# Patient Record
Sex: Male | Born: 1995 | Race: Black or African American | Hispanic: No | Marital: Single | Smoking: Never smoker
Health system: Southern US, Community
[De-identification: ages and names within clinical notes are randomized; demographics above are authoritative.]

## PROBLEM LIST (undated history)

## (undated) DIAGNOSIS — M419 Scoliosis, unspecified: Secondary | ICD-10-CM

## (undated) DIAGNOSIS — J3081 Allergic rhinitis due to animal (cat) (dog) hair and dander: Secondary | ICD-10-CM

---
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
## 2014-01-22 ENCOUNTER — Encounter (HOSPITAL_COMMUNITY): Payer: Self-pay | Admitting: Emergency Medicine

## 2014-01-22 ENCOUNTER — Emergency Department (HOSPITAL_COMMUNITY)
Admission: EM | Admit: 2014-01-22 | Discharge: 2014-01-22 | Disposition: A | Discharge status: Home / Self Care | Payer: Managed Care, Other (non HMO) | Attending: Emergency Medicine | Admitting: Emergency Medicine

## 2014-01-22 ENCOUNTER — Emergency Department (HOSPITAL_COMMUNITY): Payer: Managed Care, Other (non HMO)

## 2014-01-22 DIAGNOSIS — M419 Scoliosis, unspecified: Secondary | ICD-10-CM | POA: Insufficient documentation

## 2014-01-22 DIAGNOSIS — R51 Headache: Secondary | ICD-10-CM | POA: Insufficient documentation

## 2014-01-22 DIAGNOSIS — R55 Syncope and collapse: Secondary | ICD-10-CM | POA: Diagnosis present

## 2014-01-22 DIAGNOSIS — M6281 Muscle weakness (generalized): Secondary | ICD-10-CM | POA: Diagnosis not present

## 2014-01-22 DIAGNOSIS — R519 Headache, unspecified: Secondary | ICD-10-CM

## 2014-01-22 LAB — CBG MONITORING, ED: GLUCOSE-CAPILLARY: 103 mg/dL — AB (ref 70–99)

## 2014-01-22 LAB — BASIC METABOLIC PANEL
Anion gap: 6 (ref 5–15)
BUN: 9 mg/dL (ref 6–23)
CALCIUM: 9.3 mg/dL (ref 8.4–10.5)
CHLORIDE: 103 meq/L (ref 96–112)
CO2: 27 mmol/L (ref 19–32)
Creatinine, Ser: 1 mg/dL (ref 0.50–1.35)
GFR calc Af Amer: >90 mL/min (ref >90)
GFR calc non Af Amer: >90 mL/min (ref >90)
Glucose, Bld: 99 mg/dL (ref 70–99)
POTASSIUM: 4.1 mmol/L (ref 3.5–5.1)
SODIUM: 136 mmol/L (ref 135–145)

## 2014-01-22 LAB — CBC
HEMATOCRIT: 42.6 % (ref 39.0–52.0)
Hemoglobin: 14.7 g/dL (ref 13.0–17.0)
MCH: 30 pg (ref 26.0–34.0)
MCHC: 34.5 g/dL (ref 30.0–36.0)
MCV: 86.9 fL (ref 78.0–100.0)
Platelets: 225 10*3/uL (ref 150–400)
RBC: 4.9 MIL/uL (ref 4.22–5.81)
RDW: 12.3 % (ref 11.5–15.5)
WBC: 7.3 10*3/uL (ref 4.0–10.5)

## 2014-01-22 MED ORDER — KETOROLAC TROMETHAMINE 30 MG/ML IJ SOLN
30.0000 mg | Freq: Once | INTRAMUSCULAR | Status: AC
Start: 2014-01-22 — End: 2014-01-22
  Administered 2014-01-22: 30 mg via INTRAVENOUS
  Filled 2014-01-22: qty 1

## 2014-01-22 MED ORDER — SODIUM CHLORIDE 0.9 % IV BOLUS (SEPSIS)
1000.0000 mL | Freq: Once | INTRAVENOUS | Status: AC
  Administered 2014-01-22: 1000 mL via INTRAVENOUS

## 2014-01-22 NOTE — ED Notes (Signed)
cbg [id number] 

## 2014-01-22 NOTE — ED Notes (Signed)
MD at bedside. EDPA HANNAH PRESENT 
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
## 2014-01-22 NOTE — ED Notes (Signed)
Bed: ZO10WA23 Expected date:  Expected time:  Means of arrival:  Comments: HOLD FOR TRIAGE 1

## 2014-01-22 NOTE — Discharge Instructions (Signed)
1. Medications: usual home medications 2. Treatment: rest, drink plenty of fluids,  3. Follow Up: Please followup with your primary doctor in 2 days and cardiology within 1 week for discussion of your diagnoses and further evaluation after today's visit; if you do not have a primary care doctor use the resource guide provided to find one; Please return to the ER for subsequent episodes of syncope, worsening headache, vision changes or other symptoms.    Syncope Syncope is a medical term for fainting or passing out. This means you lose consciousness and drop to the ground. People are generally unconscious for less than 5 minutes. You may have some muscle twitches for up to 15 seconds before waking up and returning to normal. Syncope occurs more often in older adults, but it can happen to anyone. While most causes of syncope are not dangerous, syncope can be a sign of a serious medical problem. It is important to seek medical care.  CAUSES  Syncope is caused by a sudden drop in blood flow to the brain. The specific cause is often not determined. Factors that can bring on syncope include:  Taking medicines that lower blood pressure.  Sudden changes in posture, such as standing up quickly.  Taking more medicine than prescribed.  Standing in one place for too long.  Seizure disorders.  Dehydration and excessive exposure to heat.  Low blood sugar (hypoglycemia).  Straining to have a bowel movement.  Heart disease, irregular heartbeat, or other circulatory problems.  Fear, emotional distress, seeing blood, or severe pain. SYMPTOMS  Right before fainting, you may:  Feel dizzy or light-headed.  Feel nauseous.  See all white or all black in your field of vision.  Have cold, clammy skin. DIAGNOSIS  Your health care provider will ask about your symptoms, perform a physical exam, and perform an electrocardiogram (ECG) to record the electrical activity of your heart. Your health care  provider may also perform other heart or blood tests to determine the cause of your syncope which may include:  Transthoracic echocardiogram (TTE). During echocardiography, sound waves are used to evaluate how blood flows through your heart.  Transesophageal echocardiogram (TEE).  Cardiac monitoring. This allows your health care provider to monitor your heart rate and rhythm in real time.  Holter monitor. This is a portable device that records your heartbeat and can help diagnose heart arrhythmias. It allows your health care provider to track your heart activity for several days, if needed.  Stress tests by exercise or by giving medicine that makes the heart beat faster. TREATMENT  In most cases, no treatment is needed. Depending on the cause of your syncope, your health care provider may recommend changing or stopping some of your medicines. HOME CARE INSTRUCTIONS  Have someone stay with you until you feel stable.  Do not drive, use machinery, or play sports until your health care provider says it is okay.  Keep all follow-up appointments as directed by your health care provider.  Lie down right away if you start feeling like you might faint. Breathe deeply and steadily. Wait until all the symptoms have passed.  Drink enough fluids to keep your urine clear or pale yellow.  If you are taking blood pressure or heart medicine, get up slowly and take several minutes to sit and then stand. This can reduce dizziness. SEEK IMMEDIATE MEDICAL CARE IF:   You have a severe headache.  You have unusual pain in the chest, abdomen, or back.  You are bleeding from  your mouth or rectum, or you have black or tarry stool.  You have an irregular or very fast heartbeat.  You have pain with breathing.  You have repeated fainting or seizure-like jerking during an episode.  You faint when sitting or lying down.  You have confusion.  You have trouble walking.  You have severe weakness.  You  have vision problems. If you fainted, call your local emergency services (911 in [place] Do not drive yourself to the hospital.  MAKE SURE YOU:  Understand these instructions.  Will watch your condition.  Will get help right away if you are not doing well or get worse. Document Released: 12/19/2004 Document Revised: 12/24/2012 Document Reviewed: 02/17/2011 [facility] Patient Information 2015 [place]. This information is not intended to replace advice given to you by your health care provider. Make sure you discuss any questions you have with your health care provider.

## 2014-01-22 NOTE — ED Notes (Signed)
EKG PERFORMED.

## 2014-03-16 ENCOUNTER — Encounter: Payer: Self-pay | Admitting: Physical Therapy

## 2014-03-16 ENCOUNTER — Ambulatory Visit: Payer: Managed Care, Other (non HMO) | Attending: Orthopedic Surgery | Admitting: Physical Therapy

## 2014-03-16 DIAGNOSIS — M419 Scoliosis, unspecified: Secondary | ICD-10-CM

## 2014-03-16 DIAGNOSIS — M546 Pain in thoracic spine: Secondary | ICD-10-CM | POA: Diagnosis not present

## 2014-03-16 DIAGNOSIS — M4124 Other idiopathic scoliosis, thoracic region: Secondary | ICD-10-CM | POA: Insufficient documentation

## 2014-03-16 NOTE — Therapy (Signed)
[facility] [address] [place], [place], [address] Phone: [phone]   Fax:  [phone]  Physical Therapy Evaluation  Patient Details  Name: Michael Grimes MRN: [id number] Date of Birth: 31-May-1995 Referring Provider:  Venita Lick, MD  Encounter Date: 03/16/2014      PT End of Session - 03/16/14 1341    Visit Number 1   Date for PT Re-Evaluation 05/16/14   PT Start Time 1320   PT Stop Time 1405   PT Time Calculation (min) 45 min      Past Medical History  Diagnosis Date  . Scoliosis   . Cat allergies     History reviewed. No pertinent past surgical history.  There were no vitals filed for this visit.  Visit Diagnosis:  Bilateral thoracic back pain - Plan: PT plan of care cert/re-cert  Scoliosis of thoracic spine - Plan: PT plan of care cert/re-cert      Subjective Assessment - 03/16/14 1321    Symptoms Patient c/o pain in the thoracic area for about 4 years.  He reports that he was diagnosed with scoliosis when he was 19 year old.   Pertinent History none   Limitations Sitting;Lifting   How long can you sit comfortably? 30 minutes   Diagnostic tests scoliosis   Patient Stated Goals to have no pain   Currently in Pain? Yes   Pain Score 3    Pain Location Thoracic   Pain Orientation Left   Pain Descriptors / Indicators Aching;Tightness   Pain Type Chronic pain   Pain Frequency Constant   Aggravating Factors  sitting still or trying to go to sllep   Pain Relieving Factors changing positions   Effect of Pain on Daily Activities just uncomfortable            [facility] PT Assessment - 03/16/14 0001    Assessment   Medical Diagnosis thoracic pain   Onset Date 03/16/10   Prior Therapy no   Precautions   Precautions None   Balance Screen   Has the patient fallen in the past 6 months No   Has the patient had a decrease in activity level because of a fear of falling?  No   Is the patient  reluctant to leave their home because of a fear of falling?  No   Prior Function   Vocation --  Drives , some lifting bags   Leisure plays basketball    Posture/Postural Control   Postural Limitations Rounded Shoulders;Forward head;Increased thoracic kyphosis   Posture Comments significant slouched sitting posture, very protracted scapuale   ROM / Strength   AROM / PROM / Strength --  Lumbar ROM decreased 50%, shoulder strength 4-/5   Palpation   Palpation some tenderness in the rhomboids                   [facility] Adult PT Treatment/Exercise - 03/16/14 0001    Posture/Postural Control   Posture/Postural Control --  Has a convex to the right thoracic curve   Modalities   Modalities Administrator, sports Stimulation Location thoracic area   Electrical Stimulation Parameters IFC   Electrical Stimulation Goals Pain                PT Education - 03/16/14 1341    Education provided Yes   Education Details thoracic mobility and scapular stability exercises   Person(s) Educated Patient   Methods Explanation;Demonstration;Handout  Comprehension Verbalized understanding          PT Short Term Goals - 03/16/14 1345    PT SHORT TERM GOAL #1   Title independent with initial HEP   Time 2   Period Weeks   Status New           PT Long Term Goals - 03/16/14 1345    PT LONG TERM GOAL #1   Title report pain decreased 25%   Time 8   Period Weeks   Status New   PT LONG TERM GOAL #2   Title report 25% less difficulty going to sleep   Time 8   Period Weeks   Status New   PT LONG TERM GOAL #3   Title independent with gym activities   Time 8   Period Weeks   Status New   PT LONG TERM GOAL #4   Title understand and demonstrate proper posture and body mechanics   Time 8   Period Weeks   Status New               Plan - 03/16/14 1342    Clinical Impression Statement Pateint with significant scapular winging  and kyphosis.  Has thoracic pain and difficulty with getting comfortable with sitting or sleeping   Pt will benefit from skilled therapeutic intervention in order to improve on the following deficits Decreased range of motion;Decreased strength;Increased muscle spasms;Postural dysfunction;Pain   Rehab Potential Good   PT Frequency 2x / week   PT Duration 8 weeks   PT Treatment/Interventions Moist Heat;Electrical Stimulation;Therapeutic activities;Therapeutic exercise;Patient/family education;Manual techniques;Neuromuscular re-education   PT Next Visit Plan add gym exercises for scapular stabilization   Consulted and Agree with Plan of Care Patient          G-Codes - 03/16/14 1347    Functional Assessment Tool Used FOTO   Functional Limitation Other PT primary   Other PT Primary Current Status (B1478(G8990) At least 20 percent but less than 40 percent impaired, limited or restricted   Other PT Primary Goal Status (G9562(G8991) At least 20 percent but less than 40 percent impaired, limited or restricted       Problem List There are no active problems to display for this patient.   Jearld LeschALBRIGHT,Inmer Nix W, PT 03/16/2014, 1:49 PM  [facility] [address] [place], [place], [address] Phone: [phone]   Fax:  [phone]

## 2014-03-18 ENCOUNTER — Ambulatory Visit: Payer: Managed Care, Other (non HMO) | Admitting: Physical Therapy

## 2014-03-23 ENCOUNTER — Encounter: Payer: Self-pay | Admitting: Physical Therapy

## 2014-03-23 ENCOUNTER — Ambulatory Visit: Payer: Managed Care, Other (non HMO) | Admitting: Physical Therapy

## 2014-03-23 DIAGNOSIS — M546 Pain in thoracic spine: Secondary | ICD-10-CM | POA: Diagnosis not present

## 2014-03-23 DIAGNOSIS — M419 Scoliosis, unspecified: Secondary | ICD-10-CM

## 2014-03-23 NOTE — Therapy (Signed)
[facility] [address] [place], [place], [address] Phone: [phone]   Fax:  [phone]  Physical Therapy Treatment  Patient Details  Name: Michael Grimes MRN: [id number] Date of Birth: [birth year] Referring Provider:  Venita Lick, MD  Encounter Date: 03/23/2014      PT End of Session - 03/23/14 1407    Visit Number 2   Date for PT Re-Evaluation 05/16/14   PT Start Time 1315   PT Stop Time 1417   PT Time Calculation (min) 62 min   Activity Tolerance Patient tolerated treatment well;Patient limited by fatigue   Behavior During Therapy [facility] for tasks assessed/performed      Past Medical History  Diagnosis Date  . Scoliosis   . Cat allergies     History reviewed. No pertinent past surgical history.  There were no vitals filed for this visit.  Visit Diagnosis:  Bilateral thoracic back pain  Scoliosis of thoracic spine      Subjective Assessment - 03/23/14 1315    Symptoms Doing pretty good.   Currently in Pain? Yes   Pain Score 3    Pain Location Thoracic   Pain Orientation Left                       [facility] Adult PT Treatment/Exercise - 03/23/14 0001    Exercises   Exercises Shoulder   Shoulder Exercises: Seated   Row 15 reps  2 sets   Row Weight (lbs) 35   Other Seated Exercises serratus press 35#  2x15   Other Seated Exercises lat pull 35# 2x15   Shoulder Exercises: Prone   Other Prone Exercises modified serratus push up  2x10   Shoulder Exercises: Sidelying   Other Sidelying Exercises plank  3x30 seconds   Other Sidelying Exercises thoracic stretch  3 minutes   Shoulder Exercises: Standing   Extension 15 reps   2 sets   Extension Weight (lbs) 10   Other Standing Exercises right side superman  5# 2x10   Shoulder Exercises: ROM/Strengthening   UBE (Upper Arm Bike) 6 minutes  68fwd/3bk   Shoulder Exercises: Stretch   Other Shoulder Stretches lying on roll right  side  3 minutes   Modalities   Modalities Electrical Stimulation;Moist Heat   Moist Heat Therapy   Number Minutes Moist Heat 15 Minutes   Moist Heat Location Other (comment)  thoracic   Electrical Stimulation   Electrical Stimulation Location thoracic area   Electrical Stimulation Action     Electrical Stimulation Parameters IFC   Electrical Stimulation Goals Pain                  PT Short Term Goals - 03/23/14 1406    PT SHORT TERM GOAL #1   Title independent with initial HEP   Time 2   Period Weeks   Status Achieved           PT Long Term Goals - 03/23/14 1406    PT LONG TERM GOAL #1   Title report pain decreased 25%   Time 8   Period Weeks   Status On-going   PT LONG TERM GOAL #2   Title report 25% less difficulty going to sleep   Time 8   Period Weeks   Status On-going   PT LONG TERM GOAL #3   Title independent with gym activities   Time 8   Period Weeks   Status On-going  PT LONG TERM GOAL #4   Title understand and demonstrate proper posture and body mechanics   Time 8   Period Weeks   Status Achieved               Plan - 03/23/14 1408    Clinical Impression Statement Winging of right scapula.  Significant weakness.   Pt will benefit from skilled therapeutic intervention in order to improve on the following deficits Decreased range of motion;Decreased strength;Increased muscle spasms;Postural dysfunction;Pain   Rehab Potential Good   PT Frequency 2x / week   PT Duration 8 weeks   PT Treatment/Interventions Moist Heat;Electrical Stimulation;Therapeutic activities;Therapeutic exercise;Patient/family education;Manual techniques;Neuromuscular re-education   PT Next Visit Plan Continue to strengthen.   Consulted and Agree with Plan of Care Patient        Problem List There are no active problems to display for this patient.   Haya Hemler PTA 03/23/2014, 2:09 PM  [facility] [address] [place], [place], [address] Phone: [phone]   Fax:  [phone]

## 2014-03-25 ENCOUNTER — Ambulatory Visit: Payer: Managed Care, Other (non HMO) | Admitting: Physical Therapy

## 2014-03-31 ENCOUNTER — Ambulatory Visit: Payer: Managed Care, Other (non HMO) | Admitting: Physical Therapy

## 2014-04-01 ENCOUNTER — Ambulatory Visit: Payer: Managed Care, Other (non HMO) | Admitting: Physical Therapy

## 2014-04-01 ENCOUNTER — Encounter: Payer: Self-pay | Admitting: Physical Therapy

## 2014-04-01 DIAGNOSIS — M546 Pain in thoracic spine: Secondary | ICD-10-CM

## 2014-04-01 DIAGNOSIS — M419 Scoliosis, unspecified: Secondary | ICD-10-CM

## 2014-04-01 NOTE — Therapy (Signed)
[facility] [address] [place], [place], [address] Phone: [phone]   Fax:  [phone]  Physical Therapy Treatment  Patient Details  Name: Michael Grimes MRN: [id number] Date of Birth: 03-13-1995 Referring Provider:  Venita LickBrooks, Dahari, MD  Encounter Date: 04/01/2014      PT End of Session - 04/01/14 1044    Visit Number 3   PT Start Time 1005   PT Stop Time 1101   PT Time Calculation (min) 56 min      Past Medical History  Diagnosis Date  . Scoliosis   . Cat allergies     History reviewed. No pertinent past surgical history.  There were no vitals filed for this visit.  Visit Diagnosis:  Bilateral thoracic back pain  Scoliosis of thoracic spine      Subjective Assessment - 04/01/14 1014    Symptoms (p) No problems, tired   Patient Stated Goals (p) to have no pain   Currently in Pain? (p) Yes   Pain Score (p) 7    Pain Location (p) Thoracic   Pain Orientation (p) Left   Pain Descriptors / Indicators (p) Aching;Tightness   Pain Type (p) Chronic pain   Pain Onset (p) More than a month ago   Pain Frequency (p) Constant                       [facility] Adult PT Treatment/Exercise - 04/01/14 0001    Shoulder Exercises: Seated   Other Seated Exercises serratus press 35#   Other Seated Exercises lat pull 35# 2x15   Shoulder Exercises: Sidelying   Other Sidelying Exercises plank   Other Sidelying Exercises thoracic stretch   Shoulder Exercises: ROM/Strengthening   UBE (Upper Arm Bike) Constant work 40 watts 4 minutes   Shoulder Exercises: Stretch   Other Shoulder Stretches prone swimmers and supermans   Shoulder Exercises: Power Pensions consultantTower   Other Power Tower Exercises 5# scapular stabilization 4 ways 2x 10 each   Other Power Therapist, sportsTower Exercises overhead rythmic stabilization   Modalities   Modalities Electrical Stimulation   Moist Heat Therapy   Number Minutes Moist Heat 15 Minutes   Moist  Heat Location Other (comment)   Emergency planning/management officerlectrical Stimulation   Electrical Stimulation Location thoracic area   Electrical Stimulation Parameters IFC   Electrical Stimulation Goals Pain                  PT Short Term Goals - 03/23/14 1406    PT SHORT TERM GOAL #1   Title independent with initial HEP   Time 2   Period Weeks   Status Achieved           PT Long Term Goals - 03/23/14 1406    PT LONG TERM GOAL #1   Title report pain decreased 25%   Time 8   Period Weeks   Status On-going   PT LONG TERM GOAL #2   Title report 25% less difficulty going to sleep   Time 8   Period Weeks   Status On-going   PT LONG TERM GOAL #3   Title independent with gym activities   Time 8   Period Weeks   Status On-going   PT LONG TERM GOAL #4   Title understand and demonstrate proper posture and body mechanics   Time 8   Period Weeks   Status Achieved  Plan - 04/01/14 1044    Clinical Impression Statement continues to be weak and have difficulty correcting posture   Pt will benefit from skilled therapeutic intervention in order to improve on the following deficits Decreased range of motion;Decreased strength;Increased muscle spasms;Postural dysfunction;Pain   PT Frequency 2x / week   PT Duration 6 weeks   PT Treatment/Interventions Moist Heat;Electrical Stimulation;Therapeutic activities;Therapeutic exercise;Patient/family education;Manual techniques;Neuromuscular re-education        Problem List There are no active problems to display for this patient.   Jearld Lesch, PT 04/01/2014, 10:45 AM  [facility] [address] [place] [address] [place], [place], [address] Phone: [phone]   Fax:  [phone]

## 2014-04-02 ENCOUNTER — Ambulatory Visit: Payer: Managed Care, Other (non HMO) | Admitting: Physical Therapy

## 2014-04-08 ENCOUNTER — Ambulatory Visit: Payer: Managed Care, Other (non HMO) | Attending: Orthopedic Surgery | Admitting: Physical Therapy

## 2014-04-08 DIAGNOSIS — M546 Pain in thoracic spine: Secondary | ICD-10-CM | POA: Insufficient documentation

## 2014-04-08 DIAGNOSIS — M4124 Other idiopathic scoliosis, thoracic region: Secondary | ICD-10-CM | POA: Insufficient documentation

## 2014-09-23 ENCOUNTER — Emergency Department (HOSPITAL_COMMUNITY): Payer: Managed Care, Other (non HMO)

## 2014-09-23 ENCOUNTER — Emergency Department (HOSPITAL_COMMUNITY)
Admission: EM | Admit: 2014-09-23 | Discharge: 2014-09-23 | Disposition: A | Discharge status: Home / Self Care | Payer: Managed Care, Other (non HMO) | Attending: Emergency Medicine | Admitting: Emergency Medicine

## 2014-09-23 ENCOUNTER — Encounter (HOSPITAL_COMMUNITY): Payer: Self-pay | Admitting: Emergency Medicine

## 2014-09-23 DIAGNOSIS — J069 Acute upper respiratory infection, unspecified: Secondary | ICD-10-CM | POA: Diagnosis not present

## 2014-09-23 DIAGNOSIS — R0789 Other chest pain: Secondary | ICD-10-CM

## 2014-09-23 DIAGNOSIS — M419 Scoliosis, unspecified: Secondary | ICD-10-CM | POA: Insufficient documentation

## 2014-09-23 DIAGNOSIS — R059 Cough, unspecified: Secondary | ICD-10-CM

## 2014-09-23 DIAGNOSIS — R05 Cough: Secondary | ICD-10-CM

## 2014-09-23 DIAGNOSIS — R062 Wheezing: Secondary | ICD-10-CM

## 2014-09-23 DIAGNOSIS — R0602 Shortness of breath: Secondary | ICD-10-CM

## 2014-09-23 DIAGNOSIS — R071 Chest pain on breathing: Secondary | ICD-10-CM | POA: Diagnosis not present

## 2014-09-23 DIAGNOSIS — J45901 Unspecified asthma with (acute) exacerbation: Secondary | ICD-10-CM

## 2014-09-23 DIAGNOSIS — J302 Other seasonal allergic rhinitis: Secondary | ICD-10-CM | POA: Insufficient documentation

## 2014-09-23 DIAGNOSIS — R0981 Nasal congestion: Secondary | ICD-10-CM | POA: Insufficient documentation

## 2014-09-23 MED ORDER — LEVOFLOXACIN 750 MG PO TABS: 750.0000 mg | ORAL_TABLET | Freq: Every day | ORAL | Status: AC

## 2014-09-23 MED ORDER — GUAIFENESIN ER 600 MG PO TB12: 600.0000 mg | ORAL_TABLET | Freq: Two times a day (BID) | ORAL | Status: AC | PRN

## 2014-09-23 MED ORDER — ALBUTEROL SULFATE (2.5 MG/3ML) 0.083% IN NEBU
5.0000 mg | INHALATION_SOLUTION | Freq: Once | RESPIRATORY_TRACT | Status: AC
  Administered 2014-09-23: 5 mg via RESPIRATORY_TRACT
  Filled 2014-09-23: qty 6

## 2014-09-23 MED ORDER — IPRATROPIUM BROMIDE 0.02 % IN SOLN
0.5000 mg | Freq: Once | RESPIRATORY_TRACT | Status: AC
  Administered 2014-09-23: 0.5 mg via RESPIRATORY_TRACT
  Filled 2014-09-23: qty 2.5

## 2014-09-23 MED ORDER — GUAIFENESIN ER 600 MG PO TB12
600.0000 mg | ORAL_TABLET | Freq: Once | ORAL | Status: AC
  Administered 2014-09-23: 600 mg via ORAL
  Filled 2014-09-23: qty 1

## 2014-09-23 NOTE — ED Provider Notes (Signed)
CSN: [id number]     Arrival date & time 09/23/14  1754 History  This chart was scribed for Michael Strauss, PA-C, working with Bethann Berkshire, MD by Octavia Heir, ED Scribe. This patient was seen in room WTR6/WTR6 and the patient's care was started at 6:54 PM.    Chief Complaint  Patient presents with  . Shortness of Breath  . Cough  . Chest Pain      Patient is a 19 y.o. male presenting with shortness of breath, cough, and chest pain. The history is provided by the patient. No language interpreter was used.  Shortness of Breath Severity:  Moderate Onset quality:  Gradual Duration:  1 month Timing:  Intermittent Progression:  Worsening Chronicity:  New Context: URI   Relieved by:  Inhaler Worsened by:  Coughing Ineffective treatments:  None tried Associated symptoms: chest pain (with coughing), cough, sputum production and wheezing   Associated symptoms: no abdominal pain, no ear pain, no fever, no hemoptysis, no rash, no sore throat and no vomiting   Risk factors: no family hx of DVT, no hx of PE/DVT, no prolonged immobilization, no recent surgery and no tobacco use   Cough Associated symptoms: chest pain (with coughing), rhinorrhea, shortness of breath and wheezing   Associated symptoms: no chills, no ear pain, no eye discharge, no fever, no myalgias, no rash and no sore throat   Chest Pain Associated symptoms: cough and shortness of breath   Associated symptoms: no abdominal pain, no dysphagia, no fever, no nausea, no numbness, not vomiting and no weakness    HPI Comments: MIR FULLILOVE is a 19 y.o. male who has a PMHx of asthma and scoliosis, presents to the Emergency Department complaining of a constant, gradual worsening productive cough onset one month ago. Pt notes having an associated shortness of breath, wheezing, yellow rhinorrhea, sinus congestion, and yellow sputum production. He also has intermittent chest pain while coughing that he rates a 7/10,  intermittent central nonradiating tightness, worse with coughing, and somewhat improved with prednisone and his inhalers (Qvar and Albuterol). He was seen by his PCP and was started on Albuterol, Qvar, Prednisone, Montelukast, Flonase, Xyzal, and a Z-pack. He states his inhalers and prednisone have helped some, but the rest of the medications don't seem to have changed his symptoms, particularly the Z-pack. Denies fevers, chills, ear pain, ear drainage, sore throat, difficulty swallowing, eye symptoms, LE swelling, recent travel/surgery/immobilization, hx of DVT/PE, abd pain, N/V/D/C, hematuria, dysuria, myalgias, arthralgias, numbness, tingling, weakness, or rashes. Nonsmoker.   Past Medical History  Diagnosis Date  . Scoliosis   . Cat allergies    History reviewed. No pertinent past surgical history. History reviewed. No pertinent family history. Social History  Substance Use Topics  . Smoking status: Never Smoker   . Smokeless tobacco: Never Used  . Alcohol Use: No    Review of Systems  Constitutional: Negative for fever and chills.  HENT: Positive for rhinorrhea and sinus pressure. Negative for ear discharge, ear pain, sore throat and trouble swallowing.   Eyes: Negative for pain and discharge.  Respiratory: Positive for cough, sputum production, shortness of breath and wheezing. Negative for hemoptysis.   Cardiovascular: Positive for chest pain (with coughing). Negative for leg swelling.  Gastrointestinal: Negative for nausea, vomiting, abdominal pain, diarrhea and constipation.  Genitourinary: Negative for dysuria and hematuria.  Musculoskeletal: Negative for myalgias and arthralgias.  Skin: Negative for color change and rash.  Allergic/Immunologic: Positive for environmental allergies. Negative for immunocompromised state.  Neurological:  Negative for weakness and numbness.  Psychiatric/Behavioral: Negative for confusion.   10 Systems reviewed and are negative for acute change  except as noted in the HPI.    Allergies  Peanuts; Shellfish allergy; and Other  Home Medications   Prior to Admission medications   Medication Sig Start Date End Date Taking? Authorizing Provider  fexofenadine (ALLEGRA) 180 MG tablet Take 180 mg by mouth daily as needed for allergies.     Historical Provider, MD  HYDROcodone-acetaminophen (NORCO/VICODIN) 5-325 MG per tablet Take 2 tablets by mouth every 4 (four) hours as needed. Patient not taking: Reported on 01/22/2014 09/28/13   Elson Areas, PA-C  ibuprofen (ADVIL,MOTRIN) 800 MG tablet Take 1 tablet (800 mg total) by mouth 3 (three) times daily. Patient not taking: Reported on 01/22/2014 09/28/13   Elson Areas, PA-C   Triage vitals: BP 135/76 mmHg  Pulse 63  Temp(Src) 98.4 F (36.9 C) (Oral)  Resp 20  SpO2 98% Physical Exam  Constitutional: He is oriented to person, place, and time. Vital signs are normal. He appears well-developed and well-nourished.  Non-toxic appearance. No distress.  Afebrile, nontoxic, NAD  HENT:  Head: Normocephalic and atraumatic.  Nose: Mucosal edema and rhinorrhea present.  Mouth/Throat: Uvula is midline, oropharynx is clear and moist and mucous membranes are normal. No trismus in the jaw. No uvula swelling.  Nose with mild mucosal edema and rhinorrhea. Oropharynx clear and moist, without uvular swelling or deviation, no trismus or drooling, no tonsillar swelling or erythema, no exudates.    Eyes: Conjunctivae and EOM are normal. Right eye exhibits no discharge. Left eye exhibits no discharge.  Neck: Normal range of motion. Neck supple.  Cardiovascular: Normal rate, regular rhythm, normal heart sounds and intact distal pulses.  Exam reveals no gallop and no friction rub.   No murmur heard. Pulmonary/Chest: Effort normal. No respiratory distress. He has no decreased breath sounds. He has wheezes. He has rhonchi in the left upper field, the left middle field and the left lower field. He has no rales. He  exhibits tenderness. He exhibits no crepitus, no deformity and no retraction.  Diffuse wheezing throughout with rhonchoris  sounds in the left fields, no rales, no hypoxia or increased WOB, speaking in full sentences, SpO2 98% on RA Mild chest wall TTP anteriorly , with no crepitus, retraction or deformity  Abdominal: Soft. Normal appearance and bowel sounds are normal. He exhibits no distension. There is no tenderness. There is no rigidity, no rebound and no guarding.  Musculoskeletal: Normal range of motion.  MAE x4 Strength and sensation grossly intact Distal pulses intact No pedal edema  Neurological: He is alert and oriented to person, place, and time. He has normal strength. No sensory deficit.  Skin: Skin is warm, dry and intact. No rash noted.  Psychiatric: He has a normal mood and affect.  Nursing note and vitals reviewed.   ED Course  Procedures  DIAGNOSTIC STUDIES: Oxygen Saturation is 98% on RA, normal by my interpretation.  COORDINATION OF CARE:  7:01 PM Discussed treatment plan which includes breathing treatment with pt at bedside and pt agreed to plan.  Labs Review Labs Reviewed - No data to display  Imaging Review Dg Chest 2 View  09/23/2014   CLINICAL DATA:  Ongoing cough,congestion,sob, and chest pain x 1 month.  EXAM: CHEST  2 VIEW  COMPARISON:  08/12/2010  FINDINGS: The heart size and mediastinal contours are within normal limits. Both lungs are clear. Thoracolumbar scoliosis.  IMPRESSION: No  active cardiopulmonary disease.   Electronically Signed   By: Norva Pavlov M.D.   On: 09/23/2014 18:54   I have personally reviewed and evaluated these images and lab results as part of my medical decision-making.   EKG Interpretation   Date/Time:  Wednesday September 23 2014 18:14:23 EDT Ventricular Rate:  62 PR Interval:  121 QRS Duration: 70 QT Interval:  379 QTC Calculation: 385 R Axis:   76 Text Interpretation:  Sinus rhythm RSR' in V1 or V2, probably normal   variant Nonspecific T abnrm, anterolateral leads Confirmed by ZAMMIT  MD,  JOSEPH [id number]) on 09/23/2014 7:53:55 PM      MDM   Final diagnoses:  Cough  Shortness of breath  Costochondral chest pain  Asthma exacerbation  URI (upper respiratory infection)  Sinus congestion  Wheezing    19 y.o. male here with ongoing URI/cough x1 month, with associated costochondral chest wall pain and SOB/wheezing. EKG nonischemic and unchanged from prior.  Doubt ACS/PE/dissection, etc. Was started on inhalers, prednisone, montelukast, antihistamines, flonase, and zpak by his PCP. Zpak not helping symptoms, inhalers and prednisone helping some. Lung exam with rhonchi in L field and wheezing. CXR here clear but given length of symptoms and no improvement with abx, will switch to levaquin to treat for possible PNA. Discussed that it could be viral post-tussive syndrome but since pt still has productive cough with rhonchi in L fields and wheezing, will cover with abx. Will give nebs here. Pt taking prednisone, no need for additional dose here. Will give mucinex. Discussed OTC meds for pain/symptoms including mucinex, and continuing all other home meds that were given to him aside from the Zpak. Will reassess shortly.   8:01 PM Delays in getting nebs started but pt approx 1/2 way done with neb and already with improved lung sounds. Will allow this to finish and then d/c home with previously discussed plan. F/up with PCP in 5-7 days. I explained the diagnosis and have given explicit precautions to return to the ER including for any other new or worsening symptoms. The patient understands and accepts the medical plan as it's been dictated and I have answered their questions. Discharge instructions concerning home care and prescriptions have been given. The patient is STABLE and is discharged to home in good condition.   I personally performed the services described in this documentation, which was scribed in my  presence. The recorded information has been reviewed and is accurate.  BP 135/76 mmHg  Pulse 63  Temp(Src) 98.4 F (36.9 C) (Oral)  Resp 20  SpO2 98%  Meds ordered this encounter  Medications  . albuterol (PROVENTIL) (2.5 MG/3ML) 0.083% nebulizer solution 5 mg    Sig:   . ipratropium (ATROVENT) nebulizer solution 0.5 mg    Sig:   . guaiFENesin (MUCINEX) 12 hr tablet 600 mg    Sig:   . levofloxacin (LEVAQUIN) 750 MG tablet    Sig: Take 1 tablet (750 mg total) by mouth daily. X 7 days    Dispense:  7 tablet    Refill:  0    Order Specific Question:  Supervising Provider    Answer:  MILLER, BRIAN [[id number]]  . guaiFENesin (MUCINEX) 600 MG 12 hr tablet    Sig: Take 1 tablet (600 mg total) by mouth 2 (two) times daily as needed for cough or to loosen phlegm.    Dispense:  10 tablet    Refill:  0    Order Specific Question:  Supervising Provider  Answer:  Eber Hong [address] [place], PA-C 09/23/14 2002  Bethann Berkshire, MD 09/25/14 [phone]

## 2014-09-23 NOTE — ED Notes (Signed)
On-going SOB/cough x 1 month, states he's having a productive cough. Has been put on multiple prescription medications to try to help without relief. O2 sat 99% on RA, lungs clear in all fields, does not appear to be in any distress in triage. States he's been having centralized chest pain as well, more predominant with coughing.

## 2014-09-23 NOTE — ED Notes (Signed)
Pt reports SOB with cough x 1 month now.  Has hx of asthma.  Reports pain in his chest when coughing only. Pt reports he has been seen before for same and was given rxs without relief. Pt in NAD.

## 2014-09-23 NOTE — Discharge Instructions (Signed)
Continue to stay well-hydrated. Continue to alternate between Tylenol and Ibuprofen for pain or fever. Use Mucinex for cough suppression/expectoration of mucus. Continue to use netipot and flonase to help with nasal congestion. Use over the counter Benadryl or other prescribed antihistamine to decrease secretions and for watery itchy eyes. Continue taking the prednisone and other medications you were given by your regular doctor, except for azithromycin which you need to stop taking and start taking levaquin instead. Use your home inhalers as directed, as needed for cough/chest congestion.Followup with your primary care doctor in 5-7 days for recheck of ongoing symptoms. Return to emergency department for emergent changing or worsening of symptoms.   Asthma Asthma is a condition of the lungs in which the airways tighten and narrow. Asthma can make it hard to breathe. Asthma cannot be cured, but medicine and lifestyle changes can help control it. Asthma may be started (triggered) by:  Animal skin flakes (dander).  Dust.  Cockroaches.  Pollen.  Mold.  Smoke.  Cleaning products.  Hair sprays or aerosol sprays.  Paint fumes or strong smells.  Cold air, weather changes, and winds.  Crying or laughing hard.  Stress.  Certain medicines or drugs.  Foods, such as dried fruit, potato chips, and sparkling grape juice.  Infections or conditions (colds, flu).  Exercise.  Certain medical conditions or diseases.  Exercise or tiring activities. HOME CARE   Take medicine as told by your doctor.  Use a peak flow meter as told by your doctor. A peak flow meter is a tool that measures how well the lungs are working.  Record and keep track of the peak flow meter's readings.  Understand and use the asthma action plan. An asthma action plan is a written plan for taking care of your asthma and treating your attacks.  To help prevent asthma attacks:  Do not smoke. Stay away from  secondhand smoke.  Change your heating and air conditioning filter often.  Limit your use of fireplaces and wood stoves.  Get rid of pests (such as roaches and mice) and their droppings.  Throw away plants if you see mold on them.  Clean your floors. Dust regularly. Use cleaning products that do not smell.  Have someone vacuum when you are not home. Use a vacuum cleaner with a HEPA filter if possible.  Replace carpet with wood, tile, or vinyl flooring. Carpet can trap animal skin flakes and dust.  Use allergy-proof pillows, mattress covers, and box spring covers.  Wash bed sheets and blankets every week in hot water and dry them in a dryer.  Use blankets that are made of polyester or cotton.  Clean bathrooms and kitchens with bleach. If possible, have someone repaint the walls in these rooms with mold-resistant paint. Keep out of the rooms that are being cleaned and painted.  Wash hands often. GET HELP IF:  You have make a whistling sound when breaking (wheeze), have shortness of breath, or have a cough even if taking medicine to prevent attacks.  The colored mucus you cough up (sputum) is thicker than usual.  The colored mucus you cough up changes from clear or white to yellow, green, gray, or bloody.  You have problems from the medicine you are taking such as:  A rash.  Itching.  Swelling.  Trouble breathing.  You need reliever medicines more than 2-3 times a week.  Your peak flow measurement is still at 50-79% of your personal best after following the action plan for 1 hour.  You have a fever. GET HELP RIGHT AWAY IF:   You seem to be worse and are not responding to medicine during an asthma attack.  You are short of breath even at rest.  You get short of breath when doing very little activity.  You have trouble eating, drinking, or talking.  You have chest pain.  You have a fast heartbeat.  Your lips or fingernails start to turn blue.  You are  light-headed, dizzy, or faint.  Your peak flow is less than 50% of your personal best. MAKE SURE YOU:   Understand these instructions.  Will watch your condition.  Will get help right away if you are not doing well or get worse. Document Released: 06/07/2007 Document Revised: 05/05/2013 Document Reviewed: 07/18/2012 [facility] Patient Information 2015 [place], [place]. This information is not intended to replace advice given to you by your health care provider. Make sure you discuss any questions you have with your health care provider.  Chest Wall Pain Chest wall pain is pain felt in or around the chest bones and muscles. It may take up to 6 weeks to get better. It may take longer if you are active. Chest wall pain can happen on its own. Other times, things like germs, injury, coughing, or exercise can cause the pain. HOME CARE   Avoid activities that make you tired or cause pain. Try not to use your chest, belly (abdominal), or side muscles. Do not use heavy weights.  Put ice on the sore area.  Put ice in a plastic bag.  Place a towel between your skin and the bag.  Leave the ice on for 15-20 minutes for the first 2 days.  Only take medicine as told by your doctor. GET HELP RIGHT AWAY IF:   You have more pain or are very uncomfortable.  You have a fever.  Your chest pain gets worse.  You have new problems.  You feel sick to your stomach (nauseous) or throw up (vomit).  You start to sweat or feel lightheaded.  You have a cough with mucus (phlegm).  You cough up blood. MAKE SURE YOU:   Understand these instructions.  Will watch your condition.  Will get help right away if you are not doing well or get worse. Document Released: 06/07/2007 Document Revised: 03/13/2011 Document Reviewed: 08/15/2010 [facility] Patient Information 2015 [place], [place]. This information is not intended to replace advice given to you by your health care provider. Make sure you discuss any  questions you have with your health care provider.  Cough, Adult  A cough is a reflex that helps clear your throat and airways. It can help heal the body or may be a reaction to an irritated airway. A cough may only last 2 or 3 weeks (acute) or may last more than 8 weeks (chronic).  CAUSES Acute cough:  Viral or bacterial infections. Chronic cough:  Infections.  Allergies.  Asthma.  Post-nasal drip.  Smoking.  Heartburn or acid reflux.  Some medicines.  Chronic lung problems (COPD).  Cancer. SYMPTOMS   Cough.  Fever.  Chest pain.  Increased breathing rate.  High-pitched whistling sound when breathing (wheezing).  Colored mucus that you cough up (sputum). TREATMENT   A bacterial cough may be treated with antibiotic medicine.  A viral cough must run its course and will not respond to antibiotics.  Your caregiver may recommend other treatments if you have a chronic cough. HOME CARE INSTRUCTIONS   Only take over-the-counter or prescription medicines for  pain, discomfort, or fever as directed by your caregiver. Use cough suppressants only as directed by your caregiver.  Use a cold steam vaporizer or humidifier in your bedroom or home to help loosen secretions.  Sleep in a semi-upright position if your cough is worse at night.  Rest as needed.  Stop smoking if you smoke. SEEK IMMEDIATE MEDICAL CARE IF:   You have pus in your sputum.  Your cough starts to worsen.  You cannot control your cough with suppressants and are losing sleep.  You begin coughing up blood.  You have difficulty breathing.  You develop pain which is getting worse or is uncontrolled with medicine.  You have a fever. MAKE SURE YOU:   Understand these instructions.  Will watch your condition.  Will get help right away if you are not doing well or get worse. Document Released: 06/17/2010 Document Revised: 03/13/2011 Document Reviewed: 06/17/2010 [facility] Patient Information  2015 [place], [place]. This information is not intended to replace advice given to you by your health care provider. Make sure you discuss any questions you have with your health care provider.  Upper Respiratory Infection, Adult An upper respiratory infection (URI) is also sometimes known as the common cold. The upper respiratory tract includes the nose, sinuses, throat, trachea, and bronchi. Bronchi are the airways leading to the lungs. Most people improve within 1 week, but symptoms can last up to 2 weeks. A residual cough may last even longer.  CAUSES Many different viruses can infect the tissues lining the upper respiratory tract. The tissues become irritated and inflamed and often become very moist. Mucus production is also common. A cold is contagious. You can easily spread the virus to others by oral contact. This includes kissing, sharing a glass, coughing, or sneezing. Touching your mouth or nose and then touching a surface, which is then touched by another person, can also spread the virus. SYMPTOMS  Symptoms typically develop 1 to 3 days after you come in contact with a cold virus. Symptoms vary from person to person. They may include:  Runny nose.  Sneezing.  Nasal congestion.  Sinus irritation.  Sore throat.  Loss of voice (laryngitis).  Cough.  Fatigue.  Muscle aches.  Loss of appetite.  Headache.  Low-grade fever. DIAGNOSIS  You might diagnose your own cold based on familiar symptoms, since most people get a cold 2 to 3 times a year. Your caregiver can confirm this based on your exam. Most importantly, your caregiver can check that your symptoms are not due to another disease such as strep throat, sinusitis, pneumonia, asthma, or epiglottitis. Blood tests, throat tests, and X-rays are not necessary to diagnose a common cold, but they may sometimes be helpful in excluding other more serious diseases. Your caregiver will decide if any further tests are required. RISKS  AND COMPLICATIONS  You may be at risk for a more severe case of the common cold if you smoke cigarettes, have chronic heart disease (such as heart failure) or lung disease (such as asthma), or if you have a weakened immune system. The very young and very old are also at risk for more serious infections. Bacterial sinusitis, middle ear infections, and bacterial pneumonia can complicate the common cold. The common cold can worsen asthma and chronic obstructive pulmonary disease (COPD). Sometimes, these complications can require emergency medical care and may be life-threatening. PREVENTION  The best way to protect against getting a cold is to practice good hygiene. Avoid oral or hand contact with people  with cold symptoms. Wash your hands often if contact occurs. There is no clear evidence that vitamin C, vitamin E, echinacea, or exercise reduces the chance of developing a cold. However, it is always recommended to get plenty of rest and practice good nutrition. TREATMENT  Treatment is directed at relieving symptoms. There is no cure. Antibiotics are not effective, because the infection is caused by a virus, not by bacteria. Treatment may include:  Increased fluid intake. Sports drinks offer valuable electrolytes, sugars, and fluids.  Breathing heated mist or steam (vaporizer or shower).  Eating chicken soup or other clear broths, and maintaining good nutrition.  Getting plenty of rest.  Using gargles or lozenges for comfort.  Controlling fevers with ibuprofen or acetaminophen as directed by your caregiver.  Increasing usage of your inhaler if you have asthma. Zinc gel and zinc lozenges, taken in the first 24 hours of the common cold, can shorten the duration and lessen the severity of symptoms. Pain medicines may help with fever, muscle aches, and throat pain. A variety of non-prescription medicines are available to treat congestion and runny nose. Your caregiver can make recommendations and may  suggest nasal or lung inhalers for other symptoms.  HOME CARE INSTRUCTIONS   Only take over-the-counter or prescription medicines for pain, discomfort, or fever as directed by your caregiver.  Use a warm mist humidifier or inhale steam from a shower to increase air moisture. This may keep secretions moist and make it easier to breathe.  Drink enough water and fluids to keep your urine clear or pale yellow.  Rest as needed.  Return to work when your temperature has returned to normal or as your caregiver advises. You may need to stay home longer to avoid infecting others. You can also use a face mask and careful hand washing to prevent spread of the virus. SEEK MEDICAL CARE IF:   After the first few days, you feel you are getting worse rather than better.  You need your caregiver's advice about medicines to control symptoms.  You develop chills, worsening shortness of breath, or brown or red sputum. These may be signs of pneumonia.  You develop yellow or brown nasal discharge or pain in the face, especially when you bend forward. These may be signs of sinusitis.  You develop a fever, swollen neck glands, pain with swallowing, or white areas in the back of your throat. These may be signs of strep throat. SEEK IMMEDIATE MEDICAL CARE IF:   You have a fever.  You develop severe or persistent headache, ear pain, sinus pain, or chest pain.  You develop wheezing, a prolonged cough, cough up blood, or have a change in your usual mucus (if you have chronic lung disease).  You develop sore muscles or a stiff neck. Document Released: 06/14/2000 Document Revised: 03/13/2011 Document Reviewed: 03/26/2013 [facility] Patient Information 2015 [place], [place]. This information is not intended to replace advice given to you by your health care provider. Make sure you discuss any questions you have with your health care provider.

## 2017-03-31 ENCOUNTER — Other Ambulatory Visit: Payer: Self-pay

## 2017-03-31 ENCOUNTER — Emergency Department (HOSPITAL_BASED_OUTPATIENT_CLINIC_OR_DEPARTMENT_OTHER)
Admission: EM | Admit: 2017-03-31 | Discharge: 2017-03-31 | Disposition: A | Discharge status: Home / Self Care | Payer: Managed Care, Other (non HMO) | Attending: Emergency Medicine | Admitting: Emergency Medicine

## 2017-03-31 ENCOUNTER — Emergency Department (HOSPITAL_BASED_OUTPATIENT_CLINIC_OR_DEPARTMENT_OTHER): Payer: Managed Care, Other (non HMO)

## 2017-03-31 ENCOUNTER — Encounter (HOSPITAL_BASED_OUTPATIENT_CLINIC_OR_DEPARTMENT_OTHER): Payer: Self-pay | Admitting: Emergency Medicine

## 2017-03-31 DIAGNOSIS — R630 Anorexia: Secondary | ICD-10-CM | POA: Insufficient documentation

## 2017-03-31 DIAGNOSIS — J029 Acute pharyngitis, unspecified: Secondary | ICD-10-CM | POA: Diagnosis present

## 2017-03-31 DIAGNOSIS — Z79899 Other long term (current) drug therapy: Secondary | ICD-10-CM | POA: Insufficient documentation

## 2017-03-31 DIAGNOSIS — R69 Illness, unspecified: Secondary | ICD-10-CM

## 2017-03-31 DIAGNOSIS — J111 Influenza due to unidentified influenza virus with other respiratory manifestations: Secondary | ICD-10-CM | POA: Insufficient documentation

## 2017-03-31 LAB — COMPREHENSIVE METABOLIC PANEL
ALK PHOS: 53 U/L (ref 38–126)
ALT: 10 U/L — AB (ref 17–63)
AST: 15 U/L (ref 15–41)
Albumin: 3.7 g/dL (ref 3.5–5.0)
Anion gap: 11 (ref 5–15)
BILIRUBIN TOTAL: 1.1 mg/dL (ref 0.3–1.2)
BUN: 10 mg/dL (ref 6–20)
CO2: 22 mmol/L (ref 22–32)
CREATININE: 1.07 mg/dL (ref 0.61–1.24)
Calcium: 8.6 mg/dL — ABNORMAL LOW (ref 8.9–10.3)
Chloride: 99 mmol/L — ABNORMAL LOW (ref 101–111)
GFR calc Af Amer: >60 mL/min (ref >60)
GFR calc non Af Amer: >60 mL/min (ref >60)
GLUCOSE: 98 mg/dL (ref 65–99)
POTASSIUM: 3.3 mmol/L — AB (ref 3.5–5.1)
Sodium: 132 mmol/L — ABNORMAL LOW (ref 135–145)
TOTAL PROTEIN: 7.6 g/dL (ref 6.5–8.1)

## 2017-03-31 LAB — CBC WITH DIFFERENTIAL/PLATELET
BASOS ABS: 0 10*3/uL (ref 0.0–0.1)
Basophils Relative: 0 %
EOS PCT: 0 %
Eosinophils Absolute: 0 10*3/uL (ref 0.0–0.7)
HCT: 40.4 % (ref 39.0–52.0)
Hemoglobin: 14.7 g/dL (ref 13.0–17.0)
LYMPHS PCT: 27 %
Lymphs Abs: 1.7 10*3/uL (ref 0.7–4.0)
MCH: 30.4 pg (ref 26.0–34.0)
MCHC: 36.4 g/dL — ABNORMAL HIGH (ref 30.0–36.0)
MCV: 83.6 fL (ref 78.0–100.0)
MONO ABS: 1.1 10*3/uL — AB (ref 0.1–1.0)
Monocytes Relative: 17 %
Neutro Abs: 3.6 10*3/uL (ref 1.7–7.7)
Neutrophils Relative %: 56 %
PLATELETS: 198 10*3/uL (ref 150–400)
RBC: 4.83 MIL/uL (ref 4.22–5.81)
RDW: 11.6 % (ref 11.5–15.5)
WBC: 6.4 10*3/uL (ref 4.0–10.5)

## 2017-03-31 LAB — RAPID STREP SCREEN (MED CTR MEBANE ONLY): Streptococcus, Group A Screen (Direct): NEGATIVE

## 2017-03-31 MED ORDER — SODIUM CHLORIDE 0.9 % IV BOLUS
2000.0000 mL | Freq: Once | INTRAVENOUS | Status: AC
  Administered 2017-03-31: 2000 mL via INTRAVENOUS

## 2017-03-31 MED ORDER — ACETAMINOPHEN 325 MG PO TABS
650.0000 mg | ORAL_TABLET | Freq: Once | ORAL | Status: AC | PRN
  Administered 2017-03-31: 650 mg via ORAL
  Filled 2017-03-31: qty 2

## 2017-03-31 MED ORDER — IBUPROFEN 400 MG PO TABS
600.0000 mg | ORAL_TABLET | Freq: Once | ORAL | Status: AC
  Administered 2017-03-31: 600 mg via ORAL
  Filled 2017-03-31: qty 1

## 2017-03-31 MED ORDER — DEXAMETHASONE SODIUM PHOSPHATE 10 MG/ML IJ SOLN
10.0000 mg | Freq: Once | INTRAMUSCULAR | Status: AC
  Administered 2017-03-31: 10 mg via INTRAVENOUS
  Filled 2017-03-31: qty 1

## 2017-03-31 NOTE — Discharge Instructions (Signed)
Please take Ibuprofen (Advil, motrin) and Tylenol (acetaminophen) to relieve your pain.  You may take up to 600 MG (3 pills) of normal strength ibuprofen every 8 hours as needed.  In between doses of ibuprofen you make take tylenol, up to 1,000 mg (two extra strength pills).  Do not take more than 3,000 mg tylenol in a 24 hour period.  Please check all medication labels as many medications such as pain and cold medications may contain tylenol.  Do not drink alcohol while taking these medications.  Do not take other NSAID'S while taking ibuprofen (such as aleve or naproxen).  Please take ibuprofen with food to decrease stomach upset.  You have throat cultures that are being grown out.  If this does grow out strep he will be contacted for antibiotics.  If you develop worsening symptoms please seek additional medical evaluation.  Please follow-up with your primary care provider.  Once the ibuprofen and Tylenol wears off your fever will most likely come back.  Please alternate ibuprofen and Tylenol every 4 hours you should be taking a medicine.  This will help keep your fever from rebounding.  It is important that you stay well-hydrated as you are losing fluids by having a fever.    All this does appear to be the flu you may still benefit from receiving a flu shot.

## 2017-03-31 NOTE — ED Notes (Signed)
No changes, VSS, alert, NAD, calm, interactive[facility] IVF infusing.

## 2017-03-31 NOTE — ED Notes (Signed)
ED PA at [facility] 

## 2017-03-31 NOTE — ED Notes (Signed)
Alert, NAD, calm, interactive, resps e/u, speaking in clear complete sentences, no dyspnea noted, skin W&D, VSS, "feel better, but still pretty bad", c/o hot & cold, chills, HA, body aches, sore throat, "taking last day of tamiflu, started Tuesday, seen and treated on Monday, vomited x1 on Monday, (denies: continued NV, diarrhea, sob, cough). EDPA into room.

## 2017-03-31 NOTE — ED Triage Notes (Signed)
Sore throat and fever x 1 week. Taking tamiflu since Monday, symptoms persist. Pt had tylenol at 15:00

## 2017-03-31 NOTE — ED Provider Notes (Signed)
[facility] EMERGENCY DEPARTMENT Provider Note   CSN: [id number] Arrival date & time: 03/31/17  1839     History   Chief Complaint Chief Complaint  Patient presents with  . Sore Throat    HPI Michael Grimes is a 22 y.o. male who presents today for evaluation of sore throat and fever.  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  He reports that he went to an urgent care where they told him he did not have strep throat and started him on Tamiflu.  He reports that he took the Tamiflu for about 4 doses however was not seeing any difference so he stopped taking it.  He reports that his girlfriend does not have any similar symptoms.  He reports that he vomited once on Monday however denies continued nausea or vomiting.  He denies shortness of breath, however does report consistent cough.  He reports sore throat generalized body aches and not feeling well.  He denies any concern for STD exposure or HIV exposure.    He did not get a flu shot this year.   HPI  Past Medical History:  Diagnosis Date  . Cat allergies   . Scoliosis     There are no active problems to display for this patient.   History reviewed. No pertinent surgical history.      Home Medications    Prior to Admission medications   Medication Sig Start Date End Date Taking? Authorizing Provider  azithromycin (ZITHROMAX) 250 MG tablet Take 1 tablet by mouth 6 days. 09/16/14   [provider]  EPIPEN 2-PAK 0.3 MG/0.3ML SOAJ injection Inject 0.3 mLs into the skin as needed. For allergic reaction 09/16/14   [provider]  fexofenadine (ALLEGRA) 180 MG tablet Take 180 mg by mouth daily as needed for allergies.     [provider]  guaiFENesin (MUCINEX) 600 MG 12 hr tablet Take 1 tablet (600 mg total) by mouth 2 (two) times daily as needed for cough or to loosen phlegm. 09/23/14   Street, [place], PA-C  levocetirizine  (XYZAL) 5 MG tablet Take 1 tablet by mouth daily. 09/16/14   [provider]  levofloxacin (LEVAQUIN) 750 MG tablet Take 1 tablet (750 mg total) by mouth daily. X 7 days 09/23/14   Street, [place], PA-C  montelukast (SINGULAIR) 10 MG tablet Take 1 tablet by mouth daily as needed. allergies 09/16/14   [provider]  PATADAY 0.2 % SOLN Place 1 drop into both eyes 2 (two) times daily as needed. allergies 09/16/14   [provider]  predniSONE (STERAPRED UNI-PAK 21 TAB) 10 MG (21) TBPK tablet Take 1-6 tablets by mouth as directed. 6 day tapper dose 09/16/14   [provider]  PROAIR HFA 108 (90 BASE) MCG/ACT inhaler Inhale 2 puffs into the lungs every 6 (six) hours as needed. Shortness of breath 09/16/14   [provider]  QVAR 80 MCG/ACT inhaler Take 2 puffs by mouth 2 (two) times daily. 09/16/14   [provider]    Family History No family history on file.  Social History Social History   Tobacco Use  . Smoking status: Never Smoker  . Smokeless tobacco: Never Used  Substance Use Topics  . Alcohol use: No  . Drug use: No     Allergies   Peanuts [peanut oil]; Shellfish allergy; and Other   Review of Systems Review of Systems  Constitutional: Positive for chills, fatigue and fever.  HENT: Positive for sore throat. Negative for congestion, ear discharge, ear pain, postnasal drip, rhinorrhea, trouble swallowing and voice change.   Eyes: Negative for visual disturbance.  Respiratory: Positive for cough. Negative for shortness of breath.   Cardiovascular: Negative for chest pain.  Gastrointestinal: Negative for abdominal pain, nausea and vomiting.  Musculoskeletal: Positive for myalgias. Negative for neck pain.  Skin: Negative for rash.  Neurological: Positive for headaches (Slight). Negative for dizziness, syncope and facial asymmetry.  Psychiatric/Behavioral: Negative for confusion. The patient is not nervous/anxious.   All other  systems reviewed and are negative.    Physical Exam Updated Vital Signs BP 136/77   Pulse 78   Temp 98.6 F (37 C) (Oral)   Resp 16   Ht 6\' 6"  (1.981 m)   Wt 66.7 kg (147 lb)   SpO2 100%   BMI 16.99 kg/m   Physical Exam  Constitutional: He is oriented to person, place, and time. He appears well-developed and well-nourished. No distress.  HENT:  Head: Normocephalic and atraumatic.  Right Ear: Tympanic membrane, external ear and ear canal normal. No drainage.  Left Ear: Tympanic membrane, external ear and ear canal normal. No drainage.  Nose: Nose normal.  Mouth/Throat: Uvula is midline, oropharynx is clear and moist and mucous membranes are normal. No oral lesions. Normal dentition. No dental abscesses. No posterior oropharyngeal edema, posterior oropharyngeal erythema or tonsillar abscesses. Tonsils are 2+ on the right. Tonsils are 2+ on the left. Tonsillar exudate.  Eyes: Conjunctivae are normal. Right eye exhibits no discharge. Left eye exhibits no discharge. No scleral icterus.  Neck: Trachea normal, normal range of motion, full passive range of motion without pain and phonation normal. Neck supple.  Cardiovascular: Normal rate and regular rhythm.  Pulmonary/Chest: Effort normal and breath sounds normal. No stridor. No respiratory distress.  Abdominal: Soft. Bowel sounds are normal. He exhibits no distension.  Musculoskeletal: He exhibits no edema or deformity.  Lymphadenopathy:    He has no cervical adenopathy.  Neurological: He is alert and oriented to person, place, and time. He exhibits normal muscle tone.  Skin: Skin is warm and dry. He is not diaphoretic.  Psychiatric: He has a normal mood and affect. His behavior is normal.  Nursing note and vitals reviewed.    ED Treatments / Results  Labs (all labs ordered are listed, but only abnormal results are displayed) Labs Reviewed  COMPREHENSIVE METABOLIC PANEL - Abnormal; Notable for the following components:       Result Value   Sodium 132 (*)    Potassium 3.3 (*)    Chloride 99 (*)    Calcium 8.6 (*)    ALT 10 (*)    All other components within normal limits  CBC WITH DIFFERENTIAL/PLATELET - Abnormal; Notable for the following components:   MCHC 36.4 (*)    Monocytes Absolute 1.1 (*)    All other components within normal limits  RAPID STREP SCREEN (NOT AT [facility])  CULTURE, GROUP A STREP [facility])    EKG None  Radiology Dg Chest 2 View  Result Date: 03/31/2017 CLINICAL DATA:  Flu like symptoms. EXAM: CHEST - 2 VIEW COMPARISON:  09/23/2014 FINDINGS: Cardiomediastinal silhouette is normal. Mediastinal contours appear intact. There is no evidence of focal airspace consolidation, pleural effusion or pneumothorax. S shaped scoliosis of the spine.  Soft tissues are grossly normal. IMPRESSION: No active cardiopulmonary disease. Electronically Signed   By: Ted Mcalpine M.D.   On: 03/31/2017  22:19    Procedures Procedures (including critical care time)  Medications Ordered in ED Medications  ibuprofen (ADVIL,MOTRIN) tablet 600 mg (600 mg Oral Given 03/31/17 1851)  acetaminophen (TYLENOL) tablet 650 mg (650 mg Oral Given 03/31/17 2050)  sodium chloride 0.9 % bolus 2,000 mL (0 mLs Intravenous Stopped 03/31/17 2248)  dexamethasone (DECADRON) injection 10 mg (10 mg Intravenous Given 03/31/17 2249)     Initial Impression / Assessment and Plan / ED Course  I have reviewed the triage vital signs and the nursing notes.  Pertinent labs & imaging results that were available during my care of the patient were reviewed by me and considered in my medical decision making (see chart for details).    Patient with symptoms consistent with influenza.  Vitals are stable, low-grade fever.  No signs of dehydration, tolerating PO's, given 2 liters of IV fluids.  CXR with out infiltrates, Lungs are clear.  Based on symptoms not improving after 4 days patient requested labs be obtained, labs are unremarkable with out  significant anemia, leukocytosis or electrolyte disturbance. Patient does have bilateral symmetrical tonsillar enlargement without trismus or uvular deviation.  He was given Decadron to help with swelling of his tonsils and to help increase his appetite as he has reportedly had poor appetite recently.  Tonsils are symmetrical without uvular deviation and no trismus, I do not suspect a deep space neck infection.  He reported feeling better after Decadron administration and fluids.  Patient will be discharged with instructions to orally hydrate, rest, and use over-the-counter medications such as anti-inflammatories ibuprofen and Aleve for muscle aches and Tylenol for fever.  PCP follow up, if symptoms fail to improve may need mono testing.   Final Clinical Impressions(s) / ED Diagnoses   Final diagnoses:  Influenza-like illness  Sore throat    ED Discharge Orders    None       Norman ClayHammond, Tashunda Vandezande W, PA-C 04/01/17 0106    Rolland PorterJames, Mark, MD 04/02/17 2356

## 2017-04-03 LAB — CULTURE, GROUP A STREP (THRC)

## 2018-05-13 DIAGNOSIS — B009 Herpesviral infection, unspecified: Secondary | ICD-10-CM | POA: Diagnosis not present
